# Patient Record
Sex: Female | Born: 1980 | Race: White | Hispanic: No | Marital: Single | Smoking: Current every day smoker
Health system: Southern US, Community
[De-identification: ages and names within clinical notes are randomized; demographics above are authoritative.]

## PROBLEM LIST (undated history)

## (undated) DIAGNOSIS — N189 Chronic kidney disease, unspecified: Secondary | ICD-10-CM

## (undated) DIAGNOSIS — F329 Major depressive disorder, single episode, unspecified: Secondary | ICD-10-CM

## (undated) DIAGNOSIS — R51 Headache: Secondary | ICD-10-CM

## (undated) DIAGNOSIS — F32A Depression, unspecified: Secondary | ICD-10-CM

## (undated) DIAGNOSIS — N83209 Unspecified ovarian cyst, unspecified side: Secondary | ICD-10-CM

## (undated) HISTORY — PX: TUBAL LIGATION: SHX77

## (undated) HISTORY — PX: WISDOM TOOTH EXTRACTION: SHX21

---

## 2012-01-03 ENCOUNTER — Inpatient Hospital Stay (HOSPITAL_COMMUNITY)
Admission: AD | Admit: 2012-01-03 | Discharge: 2012-01-04 | Disposition: A | Discharge status: Home / Self Care | Payer: Self-pay | Source: Ambulatory Visit | Attending: Obstetrics and Gynecology | Admitting: Obstetrics and Gynecology

## 2012-01-03 ENCOUNTER — Encounter (HOSPITAL_COMMUNITY): Payer: Self-pay | Admitting: *Deleted

## 2012-01-03 DIAGNOSIS — N200 Calculus of kidney: Secondary | ICD-10-CM

## 2012-01-03 DIAGNOSIS — R109 Unspecified abdominal pain: Secondary | ICD-10-CM | POA: Insufficient documentation

## 2012-01-03 DIAGNOSIS — R319 Hematuria, unspecified: Secondary | ICD-10-CM | POA: Insufficient documentation

## 2012-01-03 DIAGNOSIS — N390 Urinary tract infection, site not specified: Secondary | ICD-10-CM | POA: Insufficient documentation

## 2012-01-03 HISTORY — DX: Major depressive disorder, single episode, unspecified: F32.9

## 2012-01-03 HISTORY — DX: Depression, unspecified: F32.A

## 2012-01-03 HISTORY — DX: Headache: R51

## 2012-01-03 HISTORY — DX: Unspecified ovarian cyst, unspecified side: N83.209

## 2012-01-03 HISTORY — DX: Chronic kidney disease, unspecified: N18.9

## 2012-01-03 LAB — URINE MICROSCOPIC-ADD ON

## 2012-01-03 LAB — URINALYSIS, ROUTINE W REFLEX MICROSCOPIC
Bilirubin Urine: NEGATIVE
Ketones, ur: NEGATIVE mg/dL
Nitrite: NEGATIVE
pH: 5.5 (ref 5.0–8.0)

## 2012-01-03 MED ORDER — OXYCODONE-ACETAMINOPHEN 5-325 MG PO TABS
1.0000 | ORAL_TABLET | Freq: Once | ORAL | Status: AC | PRN
  Administered 2012-01-03: 2 via ORAL
  Filled 2012-01-03: qty 2

## 2012-01-03 NOTE — ED Provider Notes (Addendum)
History     CSN: [id number]  Arrival date & time 01/03/12  2220   None     Chief Complaint  Patient presents with  . Back Pain     HPI  31 yo G2P2 presenting today for evaluation of left flank pain. Patient reports that the pain started in her left lower abdomen a few days ago and has travelled up to her flank area. Patient denies any dysuria, fever, chills. Patient with h/o kidney stones in the past and recently has been treated for 3 episodes of pyelonephritis in the past year. Patient does not have a primary care physician.  Past Medical History  Diagnosis Date  . Chronic kidney disease   . Ovarian cyst rupture   . Headache   . Asthma   . Depression     Past Surgical History  Procedure Date  . Tubal ligation   . Wisdom tooth extraction     Family History  Problem Relation Age of Onset  . Diabetes Mother   . Alcohol abuse Father   . Diabetes Maternal Grandmother   . Alcohol abuse Paternal Grandfather     History  Substance Use Topics  . Smoking status: Current Everyday Smoker -- 1.0 packs/day for 2 years    Types: Cigarettes  . Smokeless tobacco: Not on file  . Alcohol Use: Yes     occasionally    OB History    Grav Para Term Preterm Abortions TAB SAB Ect Mult Living   2 2 2       2       Review of Systems  Allergies  Review of patient's allergies indicates not on file.  Home Medications  No current outpatient prescriptions on file.  BP 129/86  Pulse 79  Temp 98.8 F (37.1 C)  Resp 18  Ht 5\' 8"  (1.727 m)  Wt 114.306 kg (252 lb)  BMI 38.32 kg/m2  LMP 10/03/2011  Physical Exam GENERAL: Well-developed, well-nourished female in no acute distress.  LUNGS: Clear to auscultation bilaterally.  HEART: Regular rate and rhythm. ABDOMEN: Soft, nontender, nondistended. No organomegaly. PELVIC: not indicated  EXTREMITIES: No cyanosis, clubbing, or edema, 2+ distal pulses. Back: Left CVA tenderness  ED Course  Procedures (including critical care  time)   Labs Reviewed  POCT PREGNANCY, URINE  URINALYSIS, ROUTINE W REFLEX MICROSCOPIC  POCT PREGNANCY, URINE   No results found.   No diagnosis found.    MDM  31 yo G2P2 with left flank pain and hematuria - suspected kidney stone- will have patient strain urine and follow-up with urologist - will treat with keflex for possible UTI - pain management with percocet - patient advised to strain urine and increase fluid intake

## 2012-01-03 NOTE — Progress Notes (Signed)
Pt reports a dull pain in lower abd x 1 week, today started having sharp, severe lower abd pain and mid back pain. Has been hospitalized for pyelo in the past and this feels like that. Chills. nausea

## 2012-01-03 NOTE — Progress Notes (Signed)
Pt c/o sore left side of back for a week and tonight worsened in back and low abd.

## 2012-01-04 LAB — CBC
MCV: 90.5 fL (ref 78.0–100.0)
Platelets: 209 10*3/uL (ref 150–400)
RBC: 4.32 MIL/uL (ref 3.87–5.11)
WBC: 11.1 10*3/uL — ABNORMAL HIGH (ref 4.0–10.5)

## 2012-01-04 MED ORDER — OXYCODONE-ACETAMINOPHEN 5-325 MG PO TABS: ORAL_TABLET | ORAL | Status: AC

## 2012-01-04 MED ORDER — CEPHALEXIN 500 MG PO CAPS: 500.0000 mg | ORAL_CAPSULE | Freq: Four times a day (QID) | ORAL | Status: AC

## 2012-01-11 ENCOUNTER — Encounter (HOSPITAL_COMMUNITY): Payer: Self-pay | Admitting: *Deleted

## 2012-01-11 ENCOUNTER — Emergency Department (HOSPITAL_COMMUNITY): Payer: Self-pay

## 2012-01-11 ENCOUNTER — Emergency Department (HOSPITAL_COMMUNITY)
Admission: EM | Admit: 2012-01-11 | Discharge: 2012-01-12 | Disposition: A | Discharge status: Home / Self Care | Payer: Self-pay | Attending: Emergency Medicine | Admitting: Emergency Medicine

## 2012-01-11 DIAGNOSIS — F329 Major depressive disorder, single episode, unspecified: Secondary | ICD-10-CM | POA: Insufficient documentation

## 2012-01-11 DIAGNOSIS — F17200 Nicotine dependence, unspecified, uncomplicated: Secondary | ICD-10-CM | POA: Insufficient documentation

## 2012-01-11 DIAGNOSIS — F3289 Other specified depressive episodes: Secondary | ICD-10-CM | POA: Insufficient documentation

## 2012-01-11 DIAGNOSIS — K5289 Other specified noninfective gastroenteritis and colitis: Secondary | ICD-10-CM | POA: Insufficient documentation

## 2012-01-11 DIAGNOSIS — J45909 Unspecified asthma, uncomplicated: Secondary | ICD-10-CM | POA: Insufficient documentation

## 2012-01-11 DIAGNOSIS — Q438 Other specified congenital malformations of intestine: Secondary | ICD-10-CM | POA: Insufficient documentation

## 2012-01-11 DIAGNOSIS — N390 Urinary tract infection, site not specified: Secondary | ICD-10-CM | POA: Insufficient documentation

## 2012-01-11 DIAGNOSIS — Z79899 Other long term (current) drug therapy: Secondary | ICD-10-CM | POA: Insufficient documentation

## 2012-01-11 DIAGNOSIS — N189 Chronic kidney disease, unspecified: Secondary | ICD-10-CM | POA: Insufficient documentation

## 2012-01-11 LAB — URINALYSIS, ROUTINE W REFLEX MICROSCOPIC
Ketones, ur: NEGATIVE mg/dL
Leukocytes, UA: NEGATIVE
Protein, ur: NEGATIVE mg/dL
Urobilinogen, UA: 0.2 mg/dL (ref 0.0–1.0)

## 2012-01-11 LAB — CBC
HCT: 42.6 % (ref 36.0–46.0)
Hemoglobin: 14.2 g/dL (ref 12.0–15.0)
RBC: 4.73 MIL/uL (ref 3.87–5.11)
WBC: 13.9 10*3/uL — ABNORMAL HIGH (ref 4.0–10.5)

## 2012-01-11 LAB — COMPREHENSIVE METABOLIC PANEL
Alkaline Phosphatase: 97 U/L (ref 39–117)
BUN: 14 mg/dL (ref 6–23)
CO2: 24 mEq/L (ref 19–32)
Chloride: 99 mEq/L (ref 96–112)
Creatinine, Ser: 0.97 mg/dL (ref 0.50–1.10)
GFR calc non Af Amer: 77 mL/min — ABNORMAL LOW (ref >90)
Total Bilirubin: 0.2 mg/dL — ABNORMAL LOW (ref 0.3–1.2)

## 2012-01-11 LAB — DIFFERENTIAL
Lymphocytes Relative: 22 % (ref 12–46)
Lymphs Abs: 3 10*3/uL (ref 0.7–4.0)
Monocytes Absolute: 0.9 10*3/uL (ref 0.1–1.0)
Monocytes Relative: 6 % (ref 3–12)
Neutro Abs: 9.8 10*3/uL — ABNORMAL HIGH (ref 1.7–7.7)

## 2012-01-11 LAB — URINE MICROSCOPIC-ADD ON

## 2012-01-11 LAB — POCT PREGNANCY, URINE: Preg Test, Ur: NEGATIVE

## 2012-01-11 MED ORDER — SODIUM CHLORIDE 0.9 % IV BOLUS (SEPSIS)
1000.0000 mL | Freq: Once | INTRAVENOUS | Status: AC
  Administered 2012-01-11: 1000 mL via INTRAVENOUS

## 2012-01-11 MED ORDER — OXYCODONE-ACETAMINOPHEN 5-325 MG PO TABS: 2.0000 | ORAL_TABLET | ORAL | Status: AC | PRN

## 2012-01-11 MED ORDER — HYDROMORPHONE HCL PF 1 MG/ML IJ SOLN
1.0000 mg | Freq: Once | INTRAMUSCULAR | Status: AC
  Administered 2012-01-11: 1 mg via INTRAVENOUS
  Filled 2012-01-11: qty 1

## 2012-01-11 MED ORDER — ONDANSETRON HCL 4 MG/2ML IJ SOLN
4.0000 mg | Freq: Once | INTRAMUSCULAR | Status: AC
  Administered 2012-01-11: 4 mg via INTRAVENOUS
  Filled 2012-01-11: qty 2

## 2012-01-11 MED ORDER — IBUPROFEN 600 MG PO TABS: 600.0000 mg | ORAL_TABLET | Freq: Three times a day (TID) | ORAL | Status: AC | PRN

## 2012-01-11 MED ORDER — PROMETHAZINE HCL 25 MG PO TABS: 25.0000 mg | ORAL_TABLET | Freq: Four times a day (QID) | ORAL | Status: AC | PRN

## 2012-01-11 NOTE — ED Notes (Signed)
Bed:WA15<BR> Expected date:<BR> Expected time:<BR> Means of arrival:<BR> Comments:<BR> Hold for triage 1 

## 2012-01-11 NOTE — ED Notes (Signed)
Patient transported to CT 

## 2012-01-11 NOTE — ED Provider Notes (Signed)
History     CSN: [id number]  Arrival date & time 01/11/12  1947   First MD Initiated Contact with Patient 01/11/12 2035      Chief Complaint  Patient presents with  . Flank Pain    LT side x 12 days.  [facility]   . Abdominal Pain    same as above.     HPI: Patient is a 31 y.o. female presenting with flank pain and abdominal pain. The history is provided by the patient.  Flank Pain This is a recurrent problem. Associated symptoms include abdominal pain. Pertinent negatives include no change in bowel habit, chest pain, chills, fever, myalgias, nausea, urinary symptoms or vomiting.  Abdominal Pain The primary symptoms of the illness include abdominal pain. The primary symptoms of the illness do not include fever, nausea or vomiting.  Symptoms associated with the illness do not include chills.  Patient reports a ten-day history of intermittent left-sided flank and lower abdominal pain. Pain severe at times and improves. Seen at [facility] Monday for same symptoms and was diagnosed with UTI and placed on antibiotics. States the pain returned with severity on Saturday and has persisted. Reports history of kidney stones and was noted at [facility] to have blood in her urine.  Past Medical History  Diagnosis Date  . Chronic kidney disease   . Ovarian cyst rupture   . Headache   . Asthma   . Depression     Past Surgical History  Procedure Date  . Tubal ligation   . Wisdom tooth extraction     Family History  Problem Relation Age of Onset  . Diabetes Mother   . Alcohol abuse Father   . Diabetes Maternal Grandmother   . Alcohol abuse Paternal Grandfather     History  Substance Use Topics  . Smoking status: Current Everyday Smoker -- 1.0 packs/day for 2 years    Types: Cigarettes  . Smokeless tobacco: Not on file  . Alcohol Use: Yes     occasionally    OB History    Grav Para Term Preterm Abortions TAB SAB Ect Mult Living    2 2 2       2       Review of Systems  Constitutional: Negative.  Negative for fever and chills.  HENT: Negative.   Eyes: Negative.   Respiratory: Negative.   Cardiovascular: Negative.  Negative for chest pain.  Gastrointestinal: Positive for abdominal pain. Negative for nausea, vomiting and change in bowel habit.  Genitourinary: Positive for flank pain.  Musculoskeletal: Negative.  Negative for myalgias.  Skin: Negative.   Neurological: Negative.   Hematological: Negative.   Psychiatric/Behavioral: Negative.     Allergies  Review of patient's allergies indicates no known allergies.  Home Medications   Current Outpatient Rx  Name Route Sig Dispense Refill  . CEPHALEXIN 500 MG PO CAPS Oral Take 1 capsule (500 mg total) by mouth 4 (four) times daily. 28 capsule 0  . IBUPROFEN 200 MG PO TABS Oral Take 400 mg by mouth every 6 (six) hours as needed.    . OXYCODONE-ACETAMINOPHEN 5-325 MG PO TABS  1-2 tablets by mouth q 4 hours as needed for pain 20 tablet 0    BP 141/101  Pulse 104  Temp(Src) 98.9 F (37.2 C) (Oral)  Resp 20  SpO2 100%  LMP 10/03/2011  Physical Exam  Constitutional: She is oriented to person, place, and time. She appears well-developed  and well-nourished.  HENT:  Head: Normocephalic and atraumatic.  Eyes: Conjunctivae are normal.  Neck: Neck supple.  Cardiovascular: Normal rate and regular rhythm.   Pulmonary/Chest: Effort normal and breath sounds normal.  Abdominal: Soft. Bowel sounds are normal.    Musculoskeletal: Normal range of motion.  Neurological: She is alert and oriented to person, place, and time.  Skin: Skin is warm and dry. No erythema.  Psychiatric: She has a normal mood and affect.    ED Course  Procedures Ct findings c/w acute epiploic appendagitis. Pt now shares that she believes that she has had a similar diagnosis in the past approximately 7 years ago when she had similar type pain. Discussed discharge treatment plan to include  anti-inflammatories and medication for pain. Will provide PCP referrals as patient is not established with a primary care physician. Patient also requesting GI referral and she will soon be eligible for insurance and would like to followup with GI for this ongoing problem.  Labs Reviewed  CBC - Abnormal; Notable for the following:    WBC 13.9 (*)    All other components within normal limits  DIFFERENTIAL - Abnormal; Notable for the following:    Neutro Abs 9.8 (*)    All other components within normal limits  COMPREHENSIVE METABOLIC PANEL - Abnormal; Notable for the following:    Total Bilirubin 0.2 (*)    GFR calc non Af Amer 77 (*)    GFR calc Af Amer 89 (*)    All other components within normal limits  URINALYSIS, ROUTINE W REFLEX MICROSCOPIC - Abnormal; Notable for the following:    Hgb urine dipstick SMALL (*)    All other components within normal limits  URINE MICROSCOPIC-ADD ON - Abnormal; Notable for the following:    Squamous Epithelial / LPF FEW (*)    All other components within normal limits  POCT PREGNANCY, URINE  POCT PREGNANCY, URINE   Ct Abdomen Pelvis Wo Contrast  01/11/2012  *RADIOLOGY REPORT*  Clinical Data: Left flank pain  CT ABDOMEN AND PELVIS WITHOUT CONTRAST  Technique:  Multidetector CT imaging of the abdomen and pelvis was performed following the standard protocol without intravenous contrast.  Comparison: None.  Findings: Lung bases clear.  Normal heart size.  No pericardial pleural effusion.  Abdomen:  Kidneys demonstrate no acute perinephric inflammatory process, hydronephrosis, obstructive uropathy, or obstructing urinary tract calculus.  No ureteral dilatation or ureteral calculus present.  In the left mid abdomen, there is focal inflammatory edema just anterior to the left descending colon centered in the pericolonic fat consistent with acute epiploic appendagitis.  No associated colonic wall thickening or diverticulitis.  Liver, gallbladder, biliary system,  pancreas, spleen, and adrenal glands within normal limits for noncontrast study.  No bowel obstruction, dilatation, ileus or free air.  No abdominal free fluid, fluid collection, hemorrhage, abscess or adenopathy.  Pelvis:  No pelvic free fluid, fluid collection, hemorrhage, adenopathy, inguinal abnormality, or hernia.  Urinary bladder under distended.  Uterus and ovaries normal in size.  No acute distal bowel process.  No acute osseous finding.  Lumbar degenerative disc disease at L1- 2.  IMPRESSION: Acute epiploic appendagitis left mid abdomen along the left descending colon.  Original Report Authenticated By: Judie Petit. Ruel Favors, M.D.     No diagnosis found.    MDM  HPI/PE and clinical findings c/w  1. Acute Epiploic Appendagitis 2. Resolving UTI        Leanne Chang, NP 01/11/12 [phone]

## 2012-01-11 NOTE — ED Notes (Signed)
Pt reports left flank pain that radiates to lft abd - pt states this has been going on since 01/03/2012 - [facility]/ [facility] and possible kidney stone - pt was given rx for antibiotics and pain medication. Pt reports pain medication helped and pain improved until Saturday night when pain became acutely worse. Pt denies any blood in her urine at present, no vaginal d/c, LNBM today. Pt also w/ tubal ligation.

## 2012-01-11 NOTE — ED Notes (Signed)
Pt in c/o left flank pain, recently dx with kidney stones and UTI, also nausea, pt states she has one more day of anitbiotics

## 2012-01-12 NOTE — ED Notes (Signed)
Rx given x3 D/c instructions reviewed w/ pt - pt denies any further questions or concerns at present.    

## 2012-01-12 NOTE — ED Provider Notes (Signed)
Medical screening examination/treatment/procedure(s) were performed by non-physician practitioner and as supervising physician I was immediately available for consultation/collaboration.  Cyndra Numbers, MD 01/12/12 [phone]

## 2012-08-30 IMAGING — CT CT ABD-PELV W/O CM
2 of 4 series · 16 of 46 positions shown, 18 images · non-contrast
Comparison: None.

CLINICAL DATA: Left flank pain

CT ABDOMEN AND PELVIS WITHOUT CONTRAST
TECHNIQUE: Multidetector CT imaging of the abdomen and pelvis was
performed following the standard protocol without intravenous
contrast.

[Series 2: abd/pel w/o · axial · non-contrast · 0.76mm/px · z∈[+1081,+1526]mm · 13 of 99 slices shown, 15 images]
[im 5/99  soft-tissue]
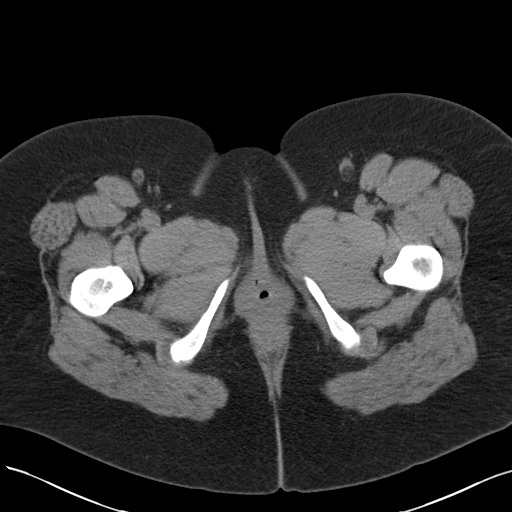
[im 5/99  bone]
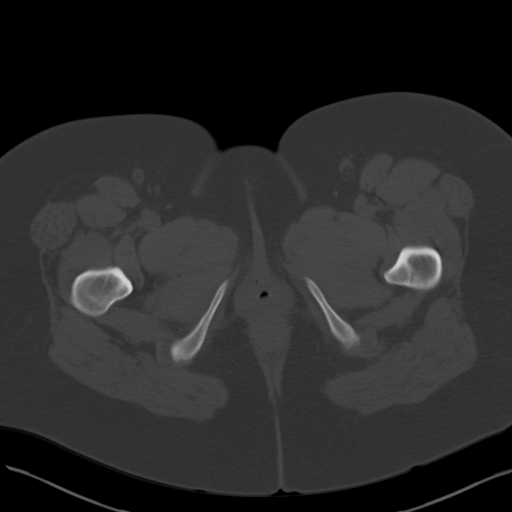
[im 13/99  soft-tissue]
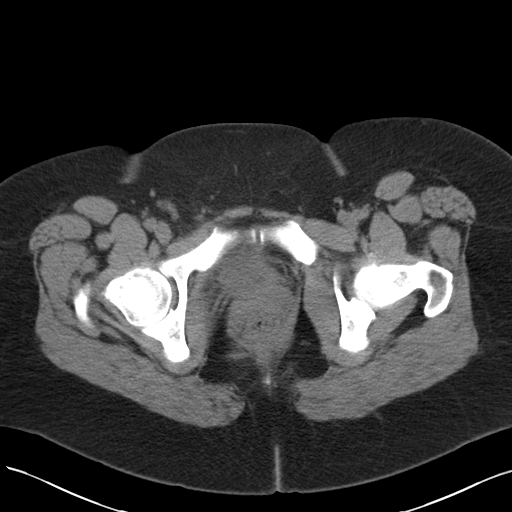
[im 21/99  soft-tissue]
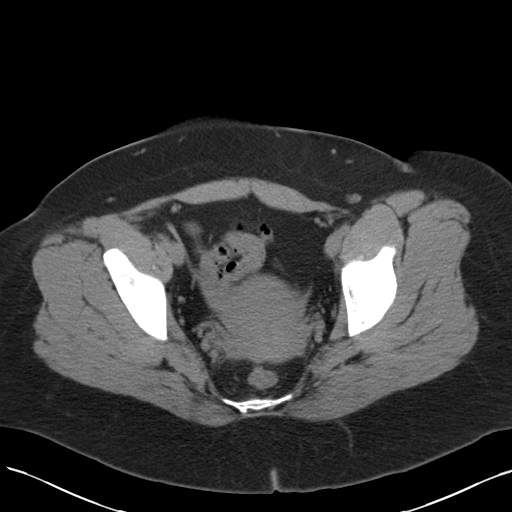
[im 29/99  soft-tissue]
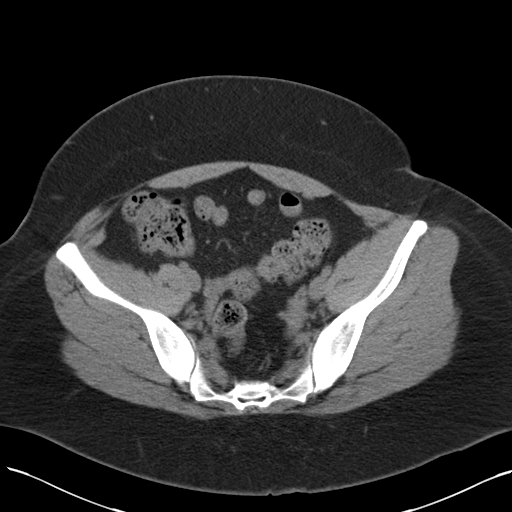
[im 33/99  soft-tissue]
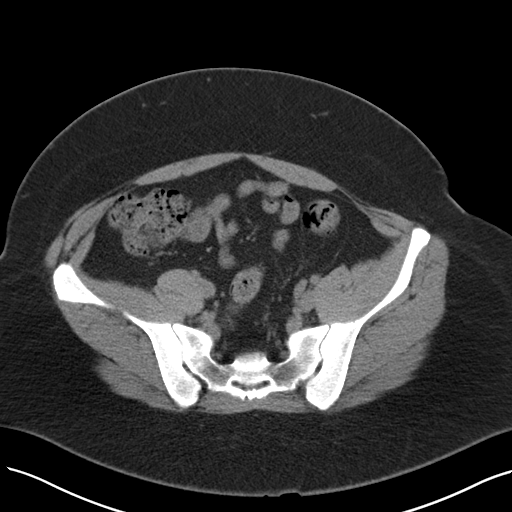
[im 41/99  soft-tissue]
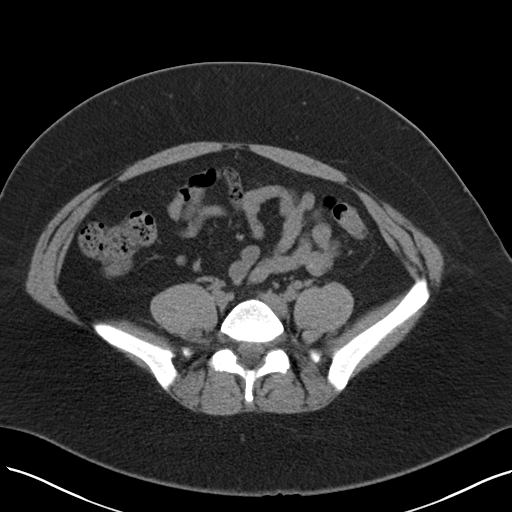
[im 50/99  soft-tissue]
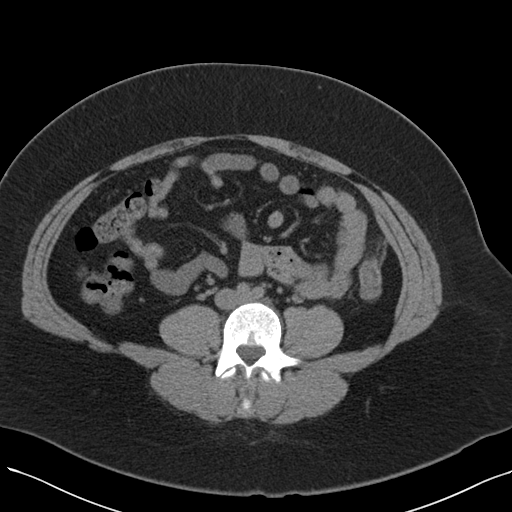
[im 58/99  soft-tissue]
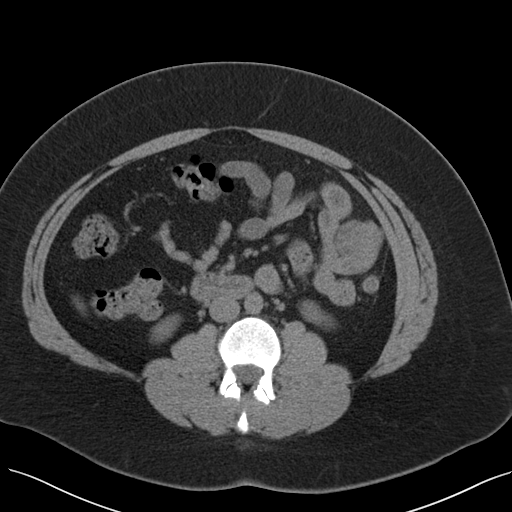
[im 66/99  soft-tissue]
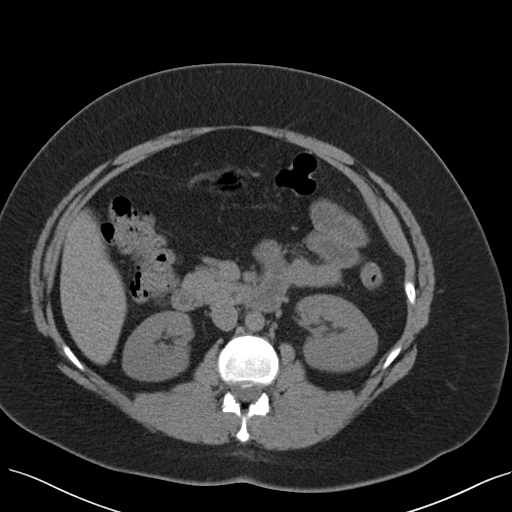
[im 66/99  bone]
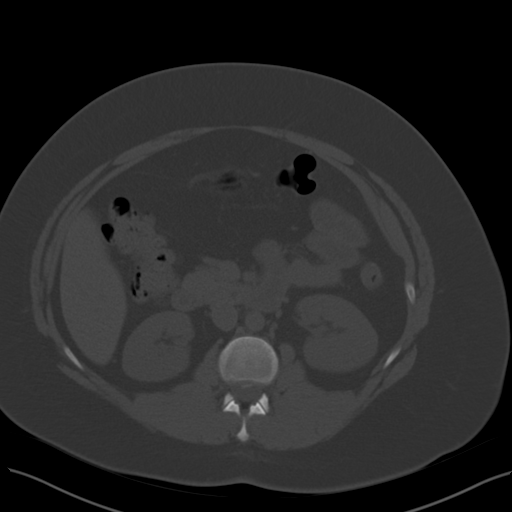
[im 70/99  soft-tissue]
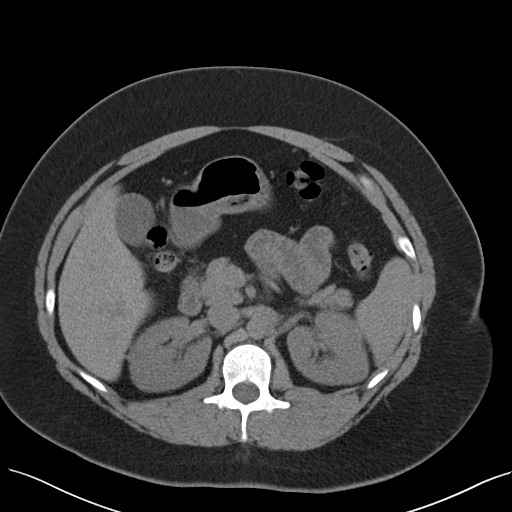
[im 78/99  soft-tissue]
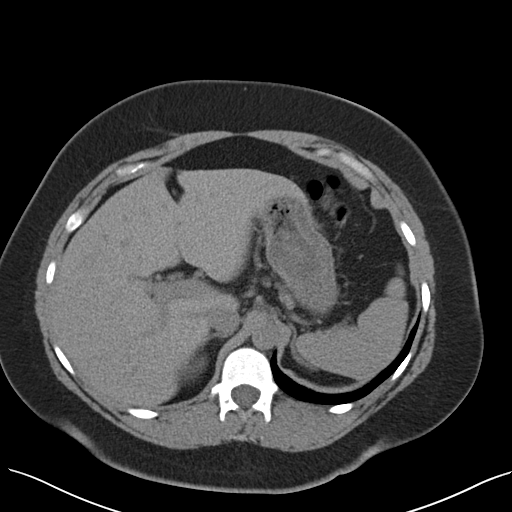
[im 86/99  soft-tissue]
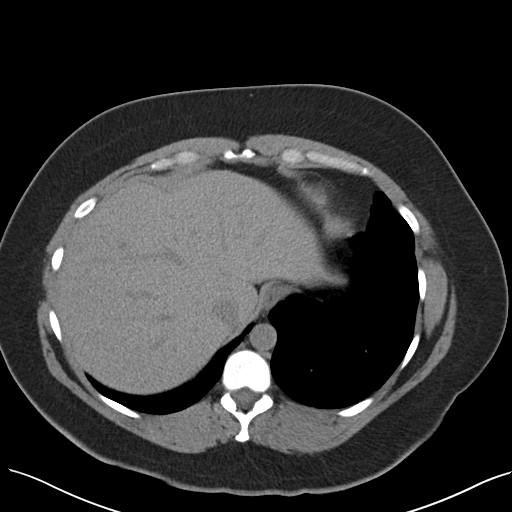
[im 94/99  soft-tissue]
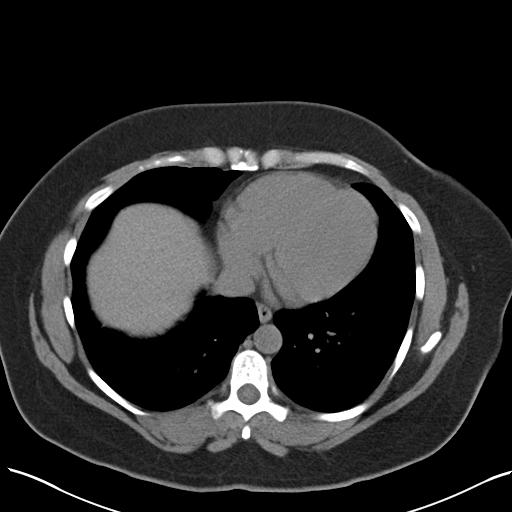

[Series 5: coronal · coronal · 0.74mm/px · 3 of 64 slices shown]
[im 22/64  soft-tissue]
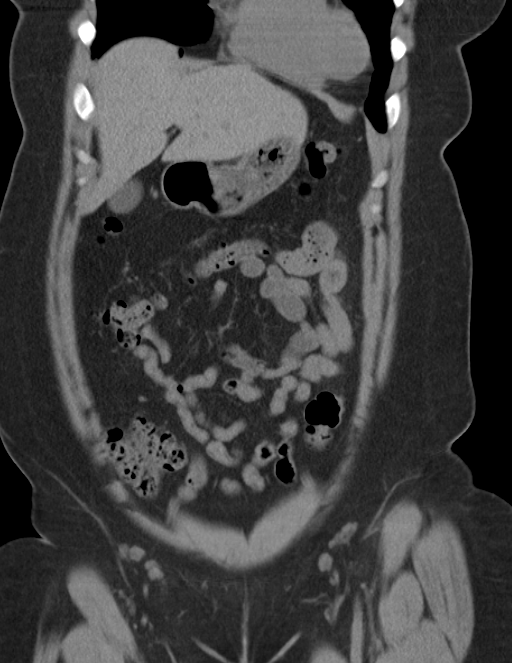
[im 29/64  soft-tissue]
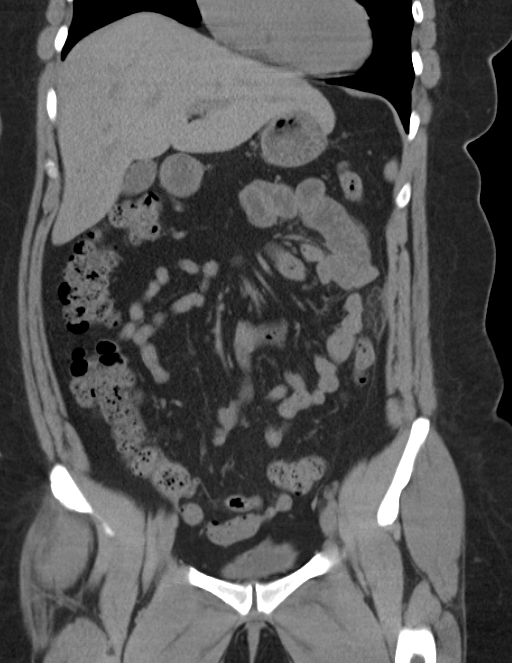
[im 36/64  soft-tissue]
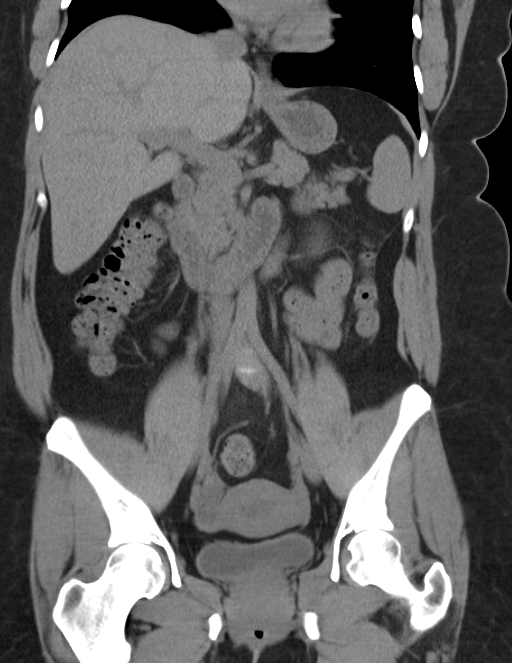

[16 of 46 positions shown; findings below may reference images not displayed]

FINDINGS: Lung bases clear.  Normal heart size.  No pericardial
pleural effusion.

Abdomen:  Kidneys demonstrate no acute perinephric inflammatory
process, hydronephrosis, obstructive uropathy, or obstructing
urinary tract calculus.  No ureteral dilatation or ureteral
calculus present.

In the left mid abdomen, there is focal inflammatory edema just
anterior to the left descending colon centered in the pericolonic
fat consistent with acute epiploic appendagitis.  No associated
colonic wall thickening or diverticulitis.

Liver, gallbladder, biliary system, pancreas, spleen, and adrenal
glands within normal limits for noncontrast study.

No bowel obstruction, dilatation, ileus or free air.

No abdominal free fluid, fluid collection, hemorrhage, abscess or
adenopathy.

Pelvis:  No pelvic free fluid, fluid collection, hemorrhage,
adenopathy, inguinal abnormality, or hernia.  Urinary bladder under
distended.  Uterus and ovaries normal in size.  No acute distal
bowel process.

No acute osseous finding.  Lumbar degenerative disc disease at L1-
2.
IMPRESSION: Acute epiploic appendagitis left mid abdomen along the left
descending colon.

## 2014-02-19 ENCOUNTER — Emergency Department: Payer: Self-pay | Admitting: Emergency Medicine

## 2014-02-22 LAB — BETA STREP CULTURE(ARMC)

## 2014-08-27 ENCOUNTER — Emergency Department: Payer: Self-pay | Admitting: Emergency Medicine

## 2014-08-27 LAB — COMPREHENSIVE METABOLIC PANEL
ALBUMIN: 3.5 g/dL (ref 3.4–5.0)
ALK PHOS: 83 U/L
AST: 14 U/L — AB (ref 15–37)
Anion Gap: 8 (ref 7–16)
BILIRUBIN TOTAL: 0.4 mg/dL (ref 0.2–1.0)
BUN: 7 mg/dL (ref 7–18)
CO2: 26 mmol/L (ref 21–32)
CREATININE: 0.81 mg/dL (ref 0.60–1.30)
Calcium, Total: 8.5 mg/dL (ref 8.5–10.1)
Chloride: 105 mmol/L (ref 98–107)
GLUCOSE: 105 mg/dL — AB (ref 65–99)
OSMOLALITY: 276 (ref 275–301)
POTASSIUM: 3.8 mmol/L (ref 3.5–5.1)
SGPT (ALT): 26 U/L
SODIUM: 139 mmol/L (ref 136–145)
TOTAL PROTEIN: 6.9 g/dL (ref 6.4–8.2)

## 2014-08-27 LAB — URINALYSIS, COMPLETE
BACTERIA: NONE SEEN
Bilirubin,UR: NEGATIVE
Glucose,UR: NEGATIVE mg/dL (ref 0–75)
KETONE: NEGATIVE
Leukocyte Esterase: NEGATIVE
NITRITE: NEGATIVE
PROTEIN: NEGATIVE
Ph: 7 (ref 4.5–8.0)
Specific Gravity: 1.01 (ref 1.003–1.030)
Squamous Epithelial: 11

## 2014-08-27 LAB — CBC WITH DIFFERENTIAL/PLATELET
BASOS PCT: 0.5 %
Basophil #: 0 10*3/uL (ref 0.0–0.1)
EOS ABS: 0.1 10*3/uL (ref 0.0–0.7)
Eosinophil %: 1.5 %
HCT: 40.6 % (ref 35.0–47.0)
HGB: 13.4 g/dL (ref 12.0–16.0)
LYMPHS PCT: 29.3 %
Lymphocyte #: 2.3 10*3/uL (ref 1.0–3.6)
MCH: 29.5 pg (ref 26.0–34.0)
MCHC: 33 g/dL (ref 32.0–36.0)
MCV: 89 fL (ref 80–100)
MONO ABS: 0.4 x10 3/mm (ref 0.2–0.9)
Monocyte %: 5.5 %
NEUTROS ABS: 5 10*3/uL (ref 1.4–6.5)
NEUTROS PCT: 63.2 %
PLATELETS: 246 10*3/uL (ref 150–440)
RBC: 4.54 10*6/uL (ref 3.80–5.20)
RDW: 13.8 % (ref 11.5–14.5)
WBC: 7.8 10*3/uL (ref 3.6–11.0)

## 2014-08-27 LAB — LIPASE, BLOOD: LIPASE: 99 U/L (ref 73–393)

## 2014-08-27 LAB — PREGNANCY, URINE: PREGNANCY TEST, URINE: NEGATIVE m[IU]/mL

## 2014-10-10 IMAGING — CR DG CHEST 2V
1 series · 2 of 2 positions shown · non-contrast
Comparison: None.

CLINICAL DATA: Cough and shortness of breath.

EXAM:
CHEST  2 VIEW

[Series 1: w chest pa · 0.14mm/px · 2 of 2 slices shown]
[im 1/2]
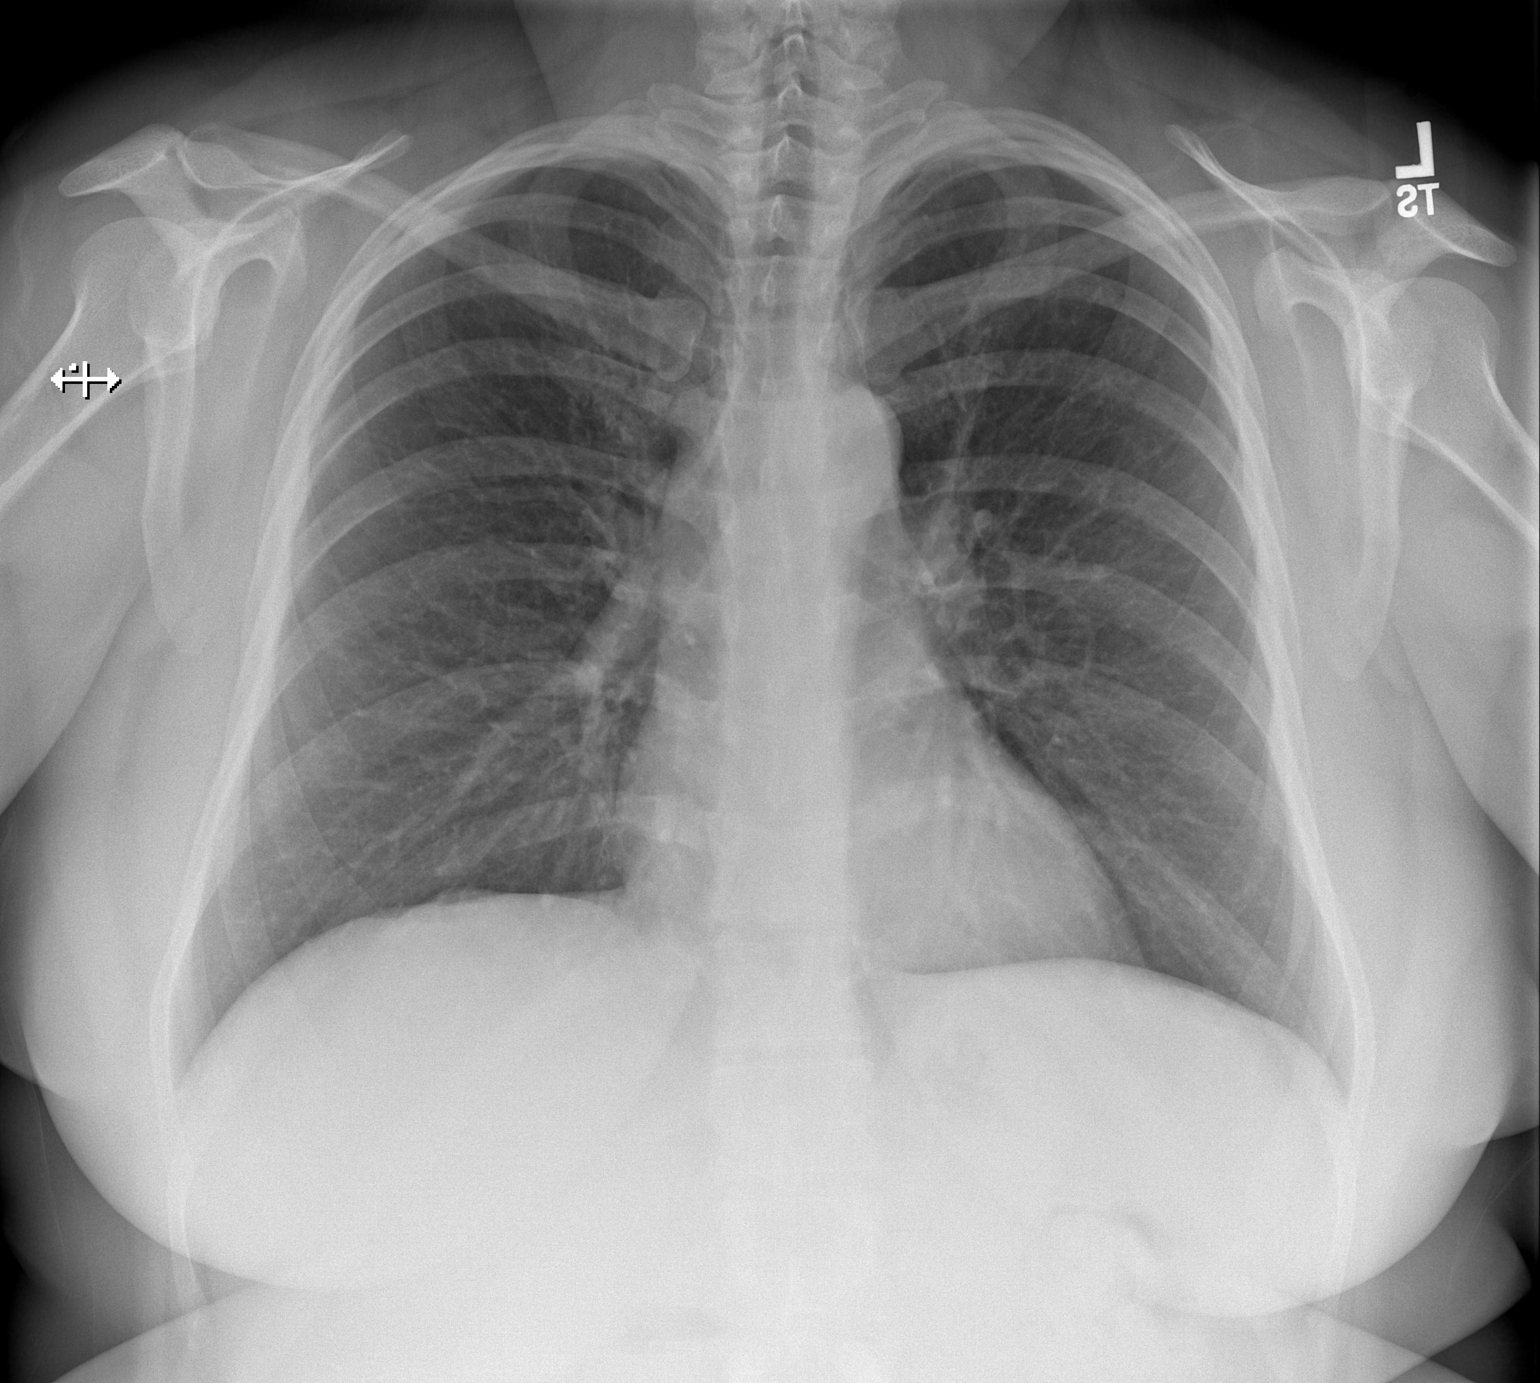
[im 2/2]
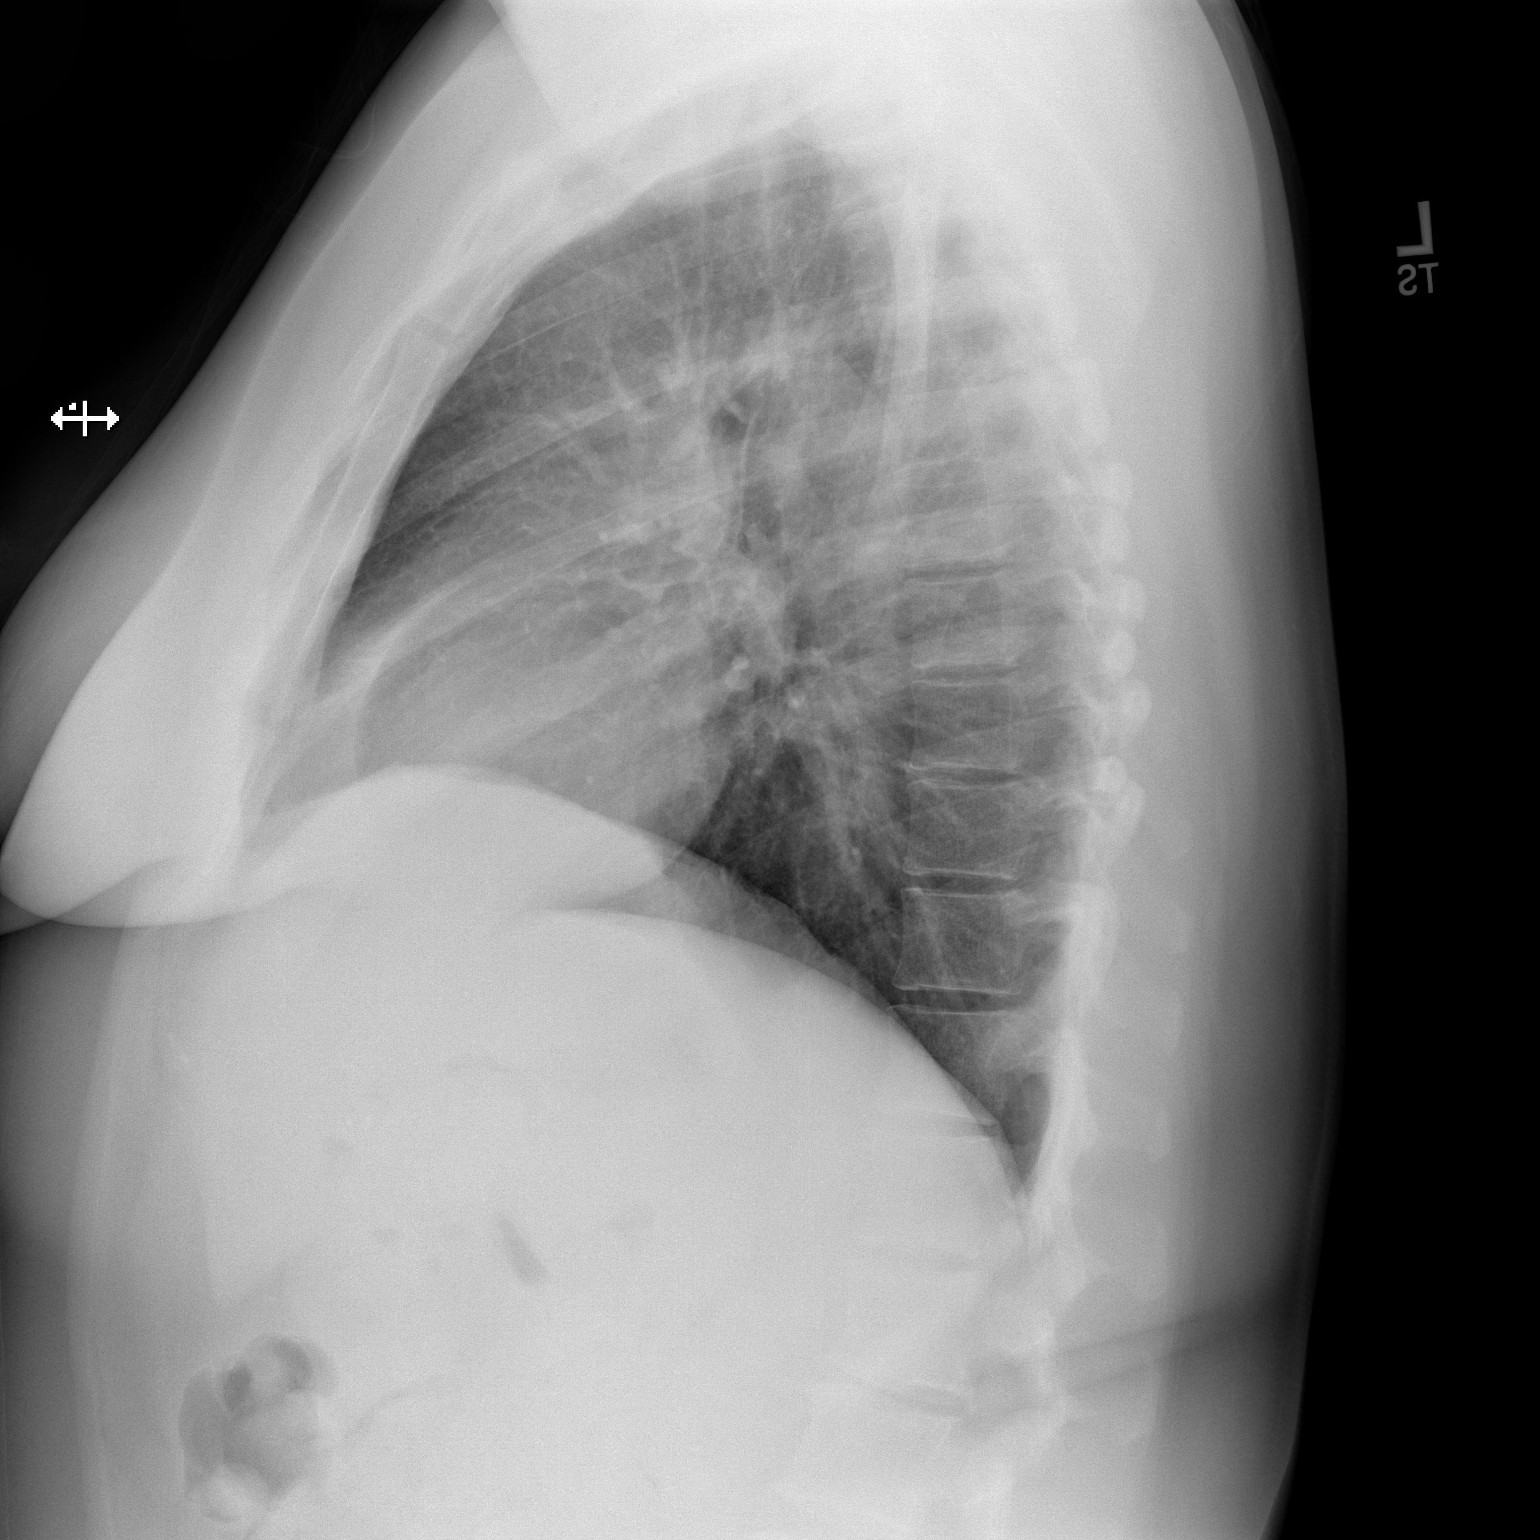

[2 of 2 positions shown; findings below may reference images not displayed]

FINDINGS: The cardiomediastinal silhouette is unremarkable.

Mild peribronchial thickening noted of uncertain chronicity.

There is no evidence of focal airspace disease, pulmonary edema,
suspicious pulmonary nodule/mass, pleural effusion, or pneumothorax.
No acute bony abnormalities are identified.
IMPRESSION: Mild peribronchial thickening without focal pneumonia.

## 2014-10-28 ENCOUNTER — Encounter (HOSPITAL_COMMUNITY): Payer: Self-pay | Admitting: *Deleted

## 2014-12-28 ENCOUNTER — Emergency Department (INDEPENDENT_AMBULATORY_CARE_PROVIDER_SITE_OTHER)
Admission: EM | Admit: 2014-12-28 | Discharge: 2014-12-28 | Disposition: A | Discharge status: Home / Self Care | Payer: 59 | Source: Home / Self Care | Attending: Family Medicine | Admitting: Family Medicine

## 2014-12-28 ENCOUNTER — Encounter (HOSPITAL_COMMUNITY): Payer: Self-pay | Admitting: Emergency Medicine

## 2014-12-28 DIAGNOSIS — R69 Illness, unspecified: Principal | ICD-10-CM

## 2014-12-28 DIAGNOSIS — J111 Influenza due to unidentified influenza virus with other respiratory manifestations: Secondary | ICD-10-CM

## 2014-12-28 MED ORDER — OSELTAMIVIR PHOSPHATE 75 MG PO CAPS: 75.0000 mg | ORAL_CAPSULE | Freq: Two times a day (BID) | ORAL | Status: AC

## 2014-12-28 MED ORDER — GUAIFENESIN-CODEINE 100-10 MG/5ML PO SYRP: 10.0000 mL | ORAL_SOLUTION | Freq: Four times a day (QID) | ORAL | Status: AC | PRN

## 2014-12-28 NOTE — ED Notes (Signed)
C/o  Chest pain with sob.  Chest pain started last night.  Vomiting and diarrhea prior.  Fever.   Body aches.  Sore throat.  No relief with otc meds.

## 2014-12-28 NOTE — ED Provider Notes (Signed)
CSN: [id number]     Arrival date & time 12/28/14  1840 History   First MD Initiated Contact with Patient 12/28/14 1926     Chief Complaint  Patient presents with  . Chest Pain  . Otalgia   (Consider location/radiation/quality/duration/timing/severity/associated sxs/prior Treatment) Patient is a 34 y.o. female presenting with chest pain. The history is provided by the patient.  Chest Pain Pain location:  L chest Pain quality: sharp   Pain radiates to:  Does not radiate Pain radiates to the back: no   Pain severity:  No pain Chronicity:  New Worsened by:  Nothing tried Ineffective treatments:  None tried Associated symptoms: fever, nausea and vomiting   Risk factors: smoking     Past Medical History  Diagnosis Date  . Chronic kidney disease   . Ovarian cyst rupture   . Headache(784.0)   . Asthma   . Depression    Past Surgical History  Procedure Laterality Date  . Tubal ligation    . Wisdom tooth extraction     Family History  Problem Relation Age of Onset  . Diabetes Mother   . Alcohol abuse Father   . Diabetes Maternal Grandmother   . Alcohol abuse Paternal Grandfather    History  Substance Use Topics  . Smoking status: Current Every Day Smoker -- 1.00 packs/day for 2 years    Types: Cigarettes  . Smokeless tobacco: Not on file  . Alcohol Use: Yes     Comment: occasionally   OB History    Gravida Para Term Preterm AB TAB SAB Ectopic Multiple Living   Review of Systems  Constitutional: Positive for fever.  HENT: Positive for congestion and sore throat.   Respiratory: Negative.   Cardiovascular: Positive for chest pain.  Gastrointestinal: Positive for nausea and vomiting.  Genitourinary: Negative.     Allergies  Review of patient's allergies indicates no known allergies.  Home Medications   Prior to Admission medications   Medication Sig Start Date End Date Taking? Authorizing Provider  guaiFENesin-codeine (ROBITUSSIN AC) 100-10  MG/5ML syrup Take 10 mLs by mouth 4 (four) times daily as needed for cough. 12/28/14   Linna Hoff, MD  ibuprofen (ADVIL,MOTRIN) 200 MG tablet Take 400 mg by mouth every 6 (six) hours as needed.    Historical Provider, MD  oseltamivir (TAMIFLU) 75 MG capsule Take 1 capsule (75 mg total) by mouth every 12 (twelve) hours. Take all of medication. 12/28/14   Linna Hoff, MD  oxyCODONE-acetaminophen (PERCOCET) 5-325 MG per tablet 1-2 tablets by mouth q 4 hours as needed for pain 01/04/12   Peggy Constant, MD   BP 134/95 mmHg  Pulse 101  Temp(Src) 98.3 F (36.8 C) (Oral)  Resp 14  SpO2 100%  LMP 12/27/2014 Physical Exam  Constitutional: She is oriented to person, place, and time. She appears well-developed and well-nourished. No distress.  HENT:  Head: Normocephalic.  Right Ear: External ear normal.  Left Ear: External ear normal.  Mouth/Throat: Oropharynx is clear and moist.  Neck: Normal range of motion. Neck supple.  Cardiovascular: Normal rate, regular rhythm, normal heart sounds and intact distal pulses.   Pulmonary/Chest: Effort normal and breath sounds normal. No respiratory distress. She has no wheezes. She has no rales.  Abdominal: Soft. Bowel sounds are normal. She exhibits no mass. There is no tenderness.  Lymphadenopathy:    She has no cervical adenopathy.  Neurological: She is  alert and oriented to person, place, and time.  Skin: Skin is warm and dry.  Nursing note and vitals reviewed.   ED Course  Procedures (including critical care time) Labs Review Labs Reviewed - No data to display  Imaging Review No results found.   MDM   1. Influenza-like illness        Linna Hoff, MD 12/28/14 1946
